# Patient Record
Sex: Male | Born: 1990 | Race: Black or African American | Hispanic: No | Marital: Single | Smoking: Current some day smoker
Health system: Southern US, Community
[De-identification: ages and names within clinical notes are randomized; demographics above are authoritative.]

## PROBLEM LIST (undated history)

## (undated) HISTORY — PX: HERNIA REPAIR: SHX51

---
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
## 2021-10-10 ENCOUNTER — Emergency Department
Admission: EM | Admit: 2021-10-10 | Discharge: 2021-10-10 | Disposition: A | Discharge status: Home / Self Care | Payer: Self-pay | Attending: Emergency Medicine | Admitting: Emergency Medicine

## 2021-10-10 ENCOUNTER — Other Ambulatory Visit: Payer: Self-pay

## 2021-10-10 ENCOUNTER — Encounter: Payer: Self-pay | Admitting: Emergency Medicine

## 2021-10-10 DIAGNOSIS — R944 Abnormal results of kidney function studies: Secondary | ICD-10-CM | POA: Insufficient documentation

## 2021-10-10 DIAGNOSIS — U071 COVID-19: Secondary | ICD-10-CM | POA: Insufficient documentation

## 2021-10-10 LAB — COMPREHENSIVE METABOLIC PANEL
ALT: 27 U/L (ref 0–44)
AST: 24 U/L (ref 15–41)
Albumin: 3.7 g/dL (ref 3.5–5.0)
Alkaline Phosphatase: 39 U/L (ref 38–126)
Anion gap: 5 (ref 5–15)
BUN: 10 mg/dL (ref 6–20)
CO2: 24 mmol/L (ref 22–32)
Calcium: 8.7 mg/dL — ABNORMAL LOW (ref 8.9–10.3)
Chloride: 104 mmol/L (ref 98–111)
Creatinine, Ser: 1.32 mg/dL — ABNORMAL HIGH (ref 0.61–1.24)
GFR, Estimated: >60 mL/min (ref >60)
Glucose, Bld: 106 mg/dL — ABNORMAL HIGH (ref 70–99)
Potassium: 3.9 mmol/L (ref 3.5–5.1)
Sodium: 133 mmol/L — ABNORMAL LOW (ref 135–145)
Total Bilirubin: 0.6 mg/dL (ref 0.3–1.2)
Total Protein: 6.6 g/dL (ref 6.5–8.1)

## 2021-10-10 LAB — CBC WITH DIFFERENTIAL/PLATELET
Abs Immature Granulocytes: 0.03 10*3/uL (ref 0.00–0.07)
Basophils Absolute: 0 10*3/uL (ref 0.0–0.1)
Basophils Relative: 1 %
Eosinophils Absolute: 0 10*3/uL (ref 0.0–0.5)
Eosinophils Relative: 1 %
HCT: 44.6 % (ref 39.0–52.0)
Hemoglobin: 14.4 g/dL (ref 13.0–17.0)
Immature Granulocytes: 1 %
Lymphocytes Relative: 16 %
Lymphs Abs: 0.8 10*3/uL (ref 0.7–4.0)
MCH: 27.3 pg (ref 26.0–34.0)
MCHC: 32.3 g/dL (ref 30.0–36.0)
MCV: 84.5 fL (ref 80.0–100.0)
Monocytes Absolute: 1.2 10*3/uL — ABNORMAL HIGH (ref 0.1–1.0)
Monocytes Relative: 22 %
Neutro Abs: 3.1 10*3/uL (ref 1.7–7.7)
Neutrophils Relative %: 59 %
Platelets: 206 10*3/uL (ref 150–400)
RBC: 5.28 MIL/uL (ref 4.22–5.81)
RDW: 12.8 % (ref 11.5–15.5)
WBC: 5.2 10*3/uL (ref 4.0–10.5)
nRBC: 0 % (ref 0.0–0.2)

## 2021-10-10 LAB — RESP PANEL BY RT-PCR (FLU A&B, COVID) ARPGX2
Influenza A by PCR: NEGATIVE
Influenza B by PCR: NEGATIVE
SARS Coronavirus 2 by RT PCR: POSITIVE — AB

## 2021-10-10 MED ORDER — ACETAMINOPHEN 500 MG PO TABS
1000.0000 mg | ORAL_TABLET | Freq: Once | ORAL | Status: AC
  Administered 2021-10-10: 1000 mg via ORAL
  Filled 2021-10-10: qty 2

## 2021-10-10 MED ORDER — IBUPROFEN 400 MG PO TABS
400.0000 mg | ORAL_TABLET | Freq: Once | ORAL | Status: AC
  Administered 2021-10-10: 400 mg via ORAL
  Filled 2021-10-10: qty 1

## 2021-10-10 NOTE — ED Provider Notes (Signed)
[facility] Provider Note    Event Date/Time   First MD Initiated Contact with Patient 10/10/21 1112     (approximate)   History   Chief Complaint Headache   HPI Sean Creps. is a 31 y.o. male, history of asthma, presents to the emergency department for evaluation of flulike symptoms.  Patient reports headache, chills, cough, bilateral knee pain, and back pain has been going on for the past week.  Patient states that he normally has chronic knee pain and back pain, but lately has been significantly worse.  Patient describes his headache as a tight sensation along the back of his head that extends to his neck. Denies chest pain, shortness of breath, abdominal pain, urinary symptoms, nausea/vomiting, or rashes/lesions  History Limitations: No limitations      Physical Exam  Triage Vital Signs: ED Triage Vitals  Enc Vitals Group     BP 10/10/21 1108 137/90     Pulse Rate 10/10/21 1108 93     Resp 10/10/21 1108 18     Temp 10/10/21 1108 98.7 F (37.1 C)     Temp src --      SpO2 10/10/21 1108 99 %     Weight 10/10/21 1044 200 lb (90.7 kg)     Height 10/10/21 1044 6' (1.829 m)     Head Circumference --      Peak Flow --      Pain Score 10/10/21 1044 7     Pain Loc --      Pain Edu? --      Excl. in GC? --     Most recent vital signs: Vitals:   10/10/21 1108 10/10/21 1222  BP: 137/90 124/70  Pulse: 93 79  Resp: 18 18  Temp: 98.7 F (37.1 C) (!) 100.7 F (38.2 C)  SpO2: 99% 98%    General: Awake, NAD.  CV: Good peripheral perfusion.  Resp: Normal effort.  Lung sounds clear bilaterally in the apices and bases.  No rhonchi or wheezing. Abd: Soft, non-tender. No distention.  Neuro: At baseline. No gross neurological deficits. Other: No nuchal rigidity.  Patient endorses mild pain with flexion of the knees bilaterally.  No gross deformities.  No remarkable swelling/erythema  Physical Exam    ED Results / Procedures / Treatments   Labs (all labs ordered are listed, but only abnormal results are displayed) Labs Reviewed  RESP PANEL BY RT-PCR (FLU A&B, COVID) ARPGX2 - Abnormal; Notable for the following components:      Result Value   SARS Coronavirus 2 by RT PCR POSITIVE (*)    All other components within normal limits  COMPREHENSIVE METABOLIC PANEL - Abnormal; Notable for the following components:   Sodium 133 (*)    Glucose, Bld 106 (*)    Creatinine, Ser 1.32 (*)    Calcium 8.7 (*)    All other components within normal limits  CBC WITH DIFFERENTIAL/PLATELET - Abnormal; Notable for the following components:   Monocytes Absolute 1.2 (*)    All other components within normal limits     EKG Not applicable.   RADIOLOGY  ED Provider Interpretation: Not applicable.  No results found.  PROCEDURES:  Critical Care performed: None.  Procedures    MEDICATIONS ORDERED IN ED: Medications  acetaminophen (TYLENOL) tablet 1,000 mg (1,000 mg Oral Given 10/10/21 1154)  ibuprofen (ADVIL) tablet 400 mg (400 mg Oral Given 10/10/21 1155)     IMPRESSION / MDM / ASSESSMENT AND PLAN /  ED COURSE  I reviewed the triage vital signs and the nursing notes.                              Sean Gearlean Alf. is a 31 y.o. male, history of asthma, presents to the emergency department for evaluation of flulike symptoms.  Patient reports headache, chills, cough, bilateral knee pain, and back pain has been going on for the past week.  Patient states that he normally has chronic knee pain and back pain, but lately has been significantly worse.  Patient describes his headache as a tight sensation along the back of his head that extends to his neck.  Differential diagnosis includes, but is not limited to, influenza, COVID, chronic back/knee pain, tension headache, migraine, meningitis  ED Course Patient appears well.  Currently endorsing headache and is febrile at 100.7 F.  We will go ahead and treat with  Tylenol/ibuprofen  CBC unremarkable for leukocytosis or anemia.  CMP notable for elevated creatinine 1.32.  Unknown baseline.  Otherwise unremarkable for clinically significant electrolyte abnormalities or transaminitis.  Respiratory panel positive for COVID-19.  Notify patient of the results.  Upon reexamination, patient states that he responded well to the Tylenol/ibuprofen.  Assessment/Plan Signs and symptoms are consistent with COVID-19 infection.  Confirmed by PCR.  Given the patient's presentation and lab work-up, I do not suspect any serious or life-threatening secondary pathology.  Patient does have an underlying history of mild asthma.  Offered the patient Paxlovid as outpatient antiviral therapy.  Patient declined and stated that he felt comfortable letting the virus work itself out.  We will plan to discharge this patient.  Patient was provided with anticipatory guidance, return precautions, and educational material. Encouraged the patient to return to the emergency department at any time if they begin to experience any new or worsening symptoms.       FINAL CLINICAL IMPRESSION(S) / ED DIAGNOSES   Final diagnoses:  COVID-19     Rx / DC Orders   ED Discharge Orders     None        Note:  This document was prepared using Dragon voice recognition software and may include unintentional dictation errors.   Varney Daily, [place] 10/10/21 1308    Gilles Chiquito, MD 10/10/21 1330

## 2021-10-10 NOTE — ED Notes (Signed)
30 yom c/o migraine since last Wednesday. The pt also c/o bilateral knee and lower back pain.

## 2021-10-10 NOTE — Discharge Instructions (Addendum)
-  Take Tylenol/ibuprofen as needed for pain/fevers -You may use over-the-counter medications such as Mucinex or Sudafed to treat symptoms as needed -Return to the emergency department at any time if you begin to experience any new or worsening symptoms. -Avoid contact with others for at least 5 days to prevent spread of the virus

## 2021-10-10 NOTE — ED Notes (Signed)
The pt was c/o being cold and appeared to have the chills. Oral temp reassessed 100.

## 2021-10-10 NOTE — ED Triage Notes (Signed)
C/O headache and 'cold shakes' pain to back of legs x 1 week.  AAOx3.  Skin warm and dry. NAD
